# Patient Record
Sex: Female | Born: 1960 | Race: White | Hispanic: No | Marital: Married | Smoking: Never smoker
Health system: Southern US, Community
[De-identification: ages and names within clinical notes are randomized; demographics above are authoritative.]

## PROBLEM LIST (undated history)

## (undated) DIAGNOSIS — N938 Other specified abnormal uterine and vaginal bleeding: Secondary | ICD-10-CM

## (undated) DIAGNOSIS — F419 Anxiety disorder, unspecified: Secondary | ICD-10-CM

## (undated) HISTORY — DX: Anxiety disorder, unspecified: F41.9

## (undated) HISTORY — PX: APPENDECTOMY: SHX54

## (undated) HISTORY — DX: Other specified abnormal uterine and vaginal bleeding: N93.8

---

## 2001-02-23 ENCOUNTER — Other Ambulatory Visit: Admission: RE | Admit: 2001-02-23 | Discharge: 2001-02-23 | Payer: Self-pay | Admitting: Obstetrics and Gynecology

## 2001-09-18 ENCOUNTER — Inpatient Hospital Stay (HOSPITAL_COMMUNITY): Admission: AD | Admit: 2001-09-18 | Discharge: 2001-09-18 | Payer: Self-pay | Admitting: Obstetrics and Gynecology

## 2001-09-19 ENCOUNTER — Inpatient Hospital Stay (HOSPITAL_COMMUNITY): Admission: AD | Admit: 2001-09-19 | Discharge: 2001-09-21 | Payer: Self-pay | Admitting: *Deleted

## 2001-10-27 ENCOUNTER — Other Ambulatory Visit: Admission: RE | Admit: 2001-10-27 | Discharge: 2001-10-27 | Payer: Self-pay | Admitting: Obstetrics and Gynecology

## 2002-10-30 ENCOUNTER — Other Ambulatory Visit: Admission: RE | Admit: 2002-10-30 | Discharge: 2002-10-30 | Payer: Self-pay | Admitting: Obstetrics and Gynecology

## 2002-11-28 ENCOUNTER — Encounter: Payer: Self-pay | Admitting: Obstetrics and Gynecology

## 2002-11-28 ENCOUNTER — Ambulatory Visit (HOSPITAL_COMMUNITY): Admission: RE | Admit: 2002-11-28 | Discharge: 2002-11-28 | Payer: Self-pay | Admitting: Obstetrics and Gynecology

## 2003-05-06 ENCOUNTER — Inpatient Hospital Stay (HOSPITAL_COMMUNITY): Admission: AD | Admit: 2003-05-06 | Discharge: 2003-05-09 | Payer: Self-pay | Admitting: Obstetrics and Gynecology

## 2003-11-08 ENCOUNTER — Other Ambulatory Visit: Admission: RE | Admit: 2003-11-08 | Discharge: 2003-11-08 | Payer: Self-pay | Admitting: Obstetrics and Gynecology

## 2004-11-13 ENCOUNTER — Other Ambulatory Visit: Admission: RE | Admit: 2004-11-13 | Discharge: 2004-11-13 | Payer: Self-pay | Admitting: Obstetrics and Gynecology

## 2005-01-13 ENCOUNTER — Ambulatory Visit (HOSPITAL_COMMUNITY): Admission: RE | Admit: 2005-01-13 | Discharge: 2005-01-13 | Payer: Self-pay | Admitting: Obstetrics and Gynecology

## 2005-03-12 ENCOUNTER — Ambulatory Visit (HOSPITAL_COMMUNITY): Admission: RE | Admit: 2005-03-12 | Discharge: 2005-03-12 | Payer: Self-pay | Admitting: Obstetrics and Gynecology

## 2006-02-09 ENCOUNTER — Ambulatory Visit (HOSPITAL_COMMUNITY): Admission: RE | Admit: 2006-02-09 | Discharge: 2006-02-09 | Payer: Self-pay | Admitting: Obstetrics and Gynecology

## 2006-03-02 ENCOUNTER — Other Ambulatory Visit: Admission: RE | Admit: 2006-03-02 | Discharge: 2006-03-02 | Payer: Self-pay | Admitting: Obstetrics and Gynecology

## 2007-02-01 ENCOUNTER — Encounter: Admission: RE | Admit: 2007-02-01 | Discharge: 2007-02-01 | Payer: Self-pay | Admitting: *Deleted

## 2007-02-13 ENCOUNTER — Ambulatory Visit (HOSPITAL_COMMUNITY): Admission: RE | Admit: 2007-02-13 | Discharge: 2007-02-13 | Payer: Self-pay | Admitting: Obstetrics and Gynecology

## 2007-09-18 ENCOUNTER — Encounter: Admission: RE | Admit: 2007-09-18 | Discharge: 2007-09-18 | Payer: Self-pay | Admitting: *Deleted

## 2007-12-10 ENCOUNTER — Emergency Department (HOSPITAL_COMMUNITY): Admission: EM | Admit: 2007-12-10 | Discharge: 2007-12-10 | Payer: Self-pay | Admitting: Emergency Medicine

## 2008-03-19 ENCOUNTER — Encounter: Admission: RE | Admit: 2008-03-19 | Discharge: 2008-03-19 | Payer: Self-pay | Admitting: *Deleted

## 2008-03-26 ENCOUNTER — Ambulatory Visit (HOSPITAL_COMMUNITY): Admission: RE | Admit: 2008-03-26 | Discharge: 2008-03-26 | Payer: Self-pay | Admitting: Obstetrics and Gynecology

## 2008-04-07 ENCOUNTER — Encounter (INDEPENDENT_AMBULATORY_CARE_PROVIDER_SITE_OTHER): Payer: Self-pay | Admitting: Surgery

## 2008-04-07 ENCOUNTER — Observation Stay (HOSPITAL_COMMUNITY): Admission: EM | Admit: 2008-04-07 | Discharge: 2008-04-08 | Payer: Self-pay | Admitting: Emergency Medicine

## 2009-01-02 ENCOUNTER — Encounter: Admission: RE | Admit: 2009-01-02 | Discharge: 2009-01-02 | Payer: Self-pay | Admitting: Family Medicine

## 2009-03-14 ENCOUNTER — Encounter: Admission: RE | Admit: 2009-03-14 | Discharge: 2009-03-14 | Payer: Self-pay | Admitting: Family Medicine

## 2009-03-31 ENCOUNTER — Ambulatory Visit (HOSPITAL_COMMUNITY): Admission: RE | Admit: 2009-03-31 | Discharge: 2009-03-31 | Payer: Self-pay | Admitting: Obstetrics and Gynecology

## 2010-05-19 ENCOUNTER — Ambulatory Visit (HOSPITAL_COMMUNITY): Admission: RE | Admit: 2010-05-19 | Discharge: 2010-05-19 | Payer: Self-pay | Admitting: Obstetrics and Gynecology

## 2010-11-06 ENCOUNTER — Encounter: Admission: RE | Admit: 2010-11-06 | Discharge: 2010-11-06 | Payer: Self-pay | Admitting: Internal Medicine

## 2011-01-17 ENCOUNTER — Encounter: Payer: Self-pay | Admitting: Obstetrics and Gynecology

## 2011-01-17 ENCOUNTER — Encounter: Payer: Self-pay | Admitting: *Deleted

## 2011-04-20 ENCOUNTER — Other Ambulatory Visit (HOSPITAL_COMMUNITY): Payer: Self-pay | Admitting: Obstetrics and Gynecology

## 2011-04-20 DIAGNOSIS — Z1231 Encounter for screening mammogram for malignant neoplasm of breast: Secondary | ICD-10-CM

## 2011-05-11 NOTE — Op Note (Signed)
Madeline Valdez, Madeline Valdez NO.:  [id number]   MEDICAL RECORD NO.:  [id number]          PATIENT TYPE:  INP   LOCATION:  5151                         FACILITY:  [facility]   PHYSICIAN:  Wilmon Arms. [name], M.D. DATE OF BIRTH:  25-Feb-1961   DATE OF PROCEDURE:  DATE OF DISCHARGE:                               OPERATIVE REPORT   PREOPERATIVE DIAGNOSIS:  Acute appendicitis.   POSTOPERATIVE DIAGNOSIS:  Acute appendicitis.   PROCEDURE PERFORMED:  Laparoscopic appendectomy.   SURGEON:  Wilmon Arms. [name], M.D., FACS   ANESTHESIA:  General endotracheal.   INDICATIONS:  The patient is a 50 year old female who presents with a 1-  day history of periumbilical pain now migrated to the right lower  quadrant.  CT scan shows acute appendicitis.  White count was elevated  at 16.3.  We were then consulted for surgical management.   DESCRIPTION OF PROCEDURE:  The patient is brought to the operating room,  placed in the supine position on the operating table.  After an adequate  level of general anesthesia was obtained, a Foley catheter was placed  under sterile technique.  The patient's abdomen was prepped with  Betadine and draped in sterile fashion.  She is positioned with her left  arm tucked.  The area just above her umbilicus was infiltrated with 4%  Marcaine with epinephrine.  A transverse incision was made.  Dissection  was carried down to the left fascia.  The fascia was opened vertically  and the peritoneal cava was bluntly entered.  The stay sutures of 0-  Vicryl was placed around the fascial opening.  The Hasson cannula was  inserted and secured to the stay suture.  Pneumoperitoneum was obtained  by insufflating CO2, maintaining maximal pressure of 15 mmHg.  The  laparoscope was inserted, and the patient was positioned in the  Trendelenburg tilted to her left.  A 5 mm port was placed in the right  upper quadrant, another 5 mL port in the left lower quadrant.  The scope  was  moved to the right upper quadrant port site.  Glassman clamps used  to mobilize the cecum medially.  We expose a very thickened, inflamed  but nonperforated appendix.  There were some thin adhesions to this,  which were taken with the harmonic scalpel.  The mesoappendix was  divided with the harmonic scalpel.  Once we had cleared the appendix all  the way down to its base, it was divided with a GIA 45 stapler.  The  appendix was placed in an EndoCatch sac.  We carefully inspected the  staple line which was intact with no sign of bleeding.  We irrigated the  right lower quadrant.  The appendix was then removed through the  umbilical port site.  Pneumoperitoneum was then released.  Hasson  trocars were removed.  The stay suture was used to close the fascia  at the umbilicus.  4-0 Monocryl was used to close the skin incisions.  Steri-Strips and clean dressings were applied.  The Foley catheter was  removed.  The patient was extubated and brought to recovery room in  stable  condition.  All sponge, instrument, and needle counts were  correct.      Wilmon Arms. Tsuei, M.D.  Electronically Signed     MKT/MEDQ  D:  04/07/2008  T:  04/08/2008  Job:  [id number]

## 2011-05-11 NOTE — H&P (Signed)
Madeline Valdez, FARVE NO.:  [id number]   MEDICAL RECORD NO.:  [id number]          PATIENT TYPE:  INP   LOCATION:  5151                         FACILITY:  [facility]   PHYSICIAN:  Wilmon Arms. [name], M.D. DATE OF BIRTH:  [birth year]   DATE OF ADMISSION:  04/07/2008  DATE OF DISCHARGE:                              HISTORY & PHYSICAL   CHIEF COMPLAINT:  Right lower quadrant pain.   HISTORY OF PRESENT ILLNESS:  The patient is a 50 year old female who  presents with a 1-day history of periumbilical pain.  This has migrated  to the right lower quadrant and has become quite intense.  She denies  any nausea or vomiting.  She still has a good appetite.  She presented  to emergency department for evaluation, and a CT scan showed a thickened  retrocecal appendix.  Her white count was elevated.   PAST MEDICAL HISTORY:  Thyroid nodules, hypothyroidism, and anxiety  disorder.   PAST SURGICAL HISTORY:  None.   FAMILY HISTORY:  Hypertension and coronary artery disease.   SOCIAL HISTORY:  Nonsmoker and nondrinker.   ALLERGIES:  None.   MEDICATIONS:  Lexapro and levothyroxine.   PHYSICAL EXAMINATION:  VITAL SIGNS:  Temp 97.8, pulse 82, respirations  20, and blood pressure 107/64.  GENERAL:  This is a well-developed, well-nourished female in no apparent  distress.  HEENT:  EOMI.  Sclerae anicteric.  NECK:  No mass or thyromegaly.  LUNGS:  Clear.  Normal respiratory effort.  HEART:  Regular rate and rhythm with no murmur.  ABDOMEN:  Tender in the right lower quadrant.  No palpable mass.  Positive Rovsing sign.  Positive psoas sign.  EXTREMITIES:  No edema.  SKIN:  Warm, dry with no sign of jaundice.   LABORATORY DATA:  White count 16.3, hemoglobin 14.2, and platelets 235.  Electrolytes within normal limits.  CT scan shows a 13-mm thickened  appendix in the retrocecal location.  No sign of abscess.   IMPRESSION:  Acute appendicitis.   PLAN:  Laparoscopic  appendectomy.      Wilmon Arms. Tsuei, M.D.  Electronically Signed     MKT/MEDQ  D:  04/07/2008  T:  04/08/2008  Job:  [id number]

## 2011-05-14 NOTE — H&P (Signed)
[facility]  Patient:    Madeline Valdez, Madeline Valdez Visit Number: [id number] MRN: [id number]          Service Type: Attending:  Silverio Lay, M.D. Dictated by:   Silverio Lay, M.D. Adm. Date:  09/19/01                           History and Physical  DATE OF BIRTH:                April 07, 1961  REASON FOR ADMISSION:         Intrauterine pregnancy at 41 weeks and 3 days to undergo induction of labor for postdates.  HISTORY OF PRESENT ILLNESS:   This is a 50 year old, married white female, gravida 1 with a due date by ultrasound evaluation of September 09, 2001, being admitted today for induction of labor for postdates. She was last seen in the office on September 15, 2001, reporting good fetal activity, denying any contractions, denying any bleeding, denying any leaking of water, and denying any symptoms of pregnancy-induced hypertension. On that visit, she underwent an NST which was reactive. A recent ultrasound on September 11, 2001, at 40 weeks and 2 days revealed an estimated fetal weight of 8 pounds 7 ounces or 63rd percentile with an amniotic fluid index of 13.6 or 55th percentile, a biophysical profile of 8/8, and an umbilical artery Doppler within normal limits. She is admitted tonight to undergo cervical ripening and induction the next morning.  PRENATAL COURSE:              Blood type A positive. RPR nonreactive. Rubella immune. HBsAg negative. HIV negative. Pap smear within normal limits. Gonorrhea negative. Chlamydia negative. A first trimester ultrasound provided [place] with a due date of September 09, 2001. A 16-week triple test revealed an increased risk of Down syndrome of 1:94. These results were explained to the patient and father of the baby. Amniocentesis was offered but declined. A 20-week ultrasound revealed a normal anatomy survey with a normal cervical length, a posterior uterine fibroid of 5.7 x 3.8 x 4.7 cm which had  remained unchanged from the previous ultrasound. A 28-week glucose tolerance test was within normal limits. A 34-week ultrasound revealed an average for gestational age with a normal amniotic fluid index. A 35-week group B strep was negative. Prenatal course was otherwise remarkable for bilateral carpal tunnel syndrome which was greatly improved by wearing splints.  ALLERGIES:                    No known drug allergies.  PAST MEDICAL HISTORY:         History of infertility. No medication for this pregnancy.  FAMILY HISTORY:               Father with heart murmur requiring open heart surgery. Father of the babys sister has cerebral palsy.  SOCIAL HISTORY:               Married, nonsmoker. Works as an Environmental health practitioner.  PHYSICAL EXAMINATION:  VITAL SIGNS:                  Current weight is 194.6 pounds for a total weight gain during this pregnancy of 35 pounds; height is 5 feet 9 inches; blood pressure 104/80.  HEENT:                        Negative.  LUNGS:                        Clear.  HEART:                        Normal.  ABDOMEN:                      Gravid, nontender. Fundal height at 39 cm. Vertex presentation.  PELVIC:                       Vaginal exam:  Closed, 2 cm long, vertex, -1.  ASSESSMENT:                   Intrauterine pregnancy at 41 weeks and 3 days with unfavorable cervix average for gestational age. Increased risk of Down syndrome on triple test marker. Amniocentesis declined.  PLAN:                         The patient is admitted on September 19, 2001, to undergo cervical ripening with Cytotec and to undergo elective induction of labor the next day with Pitocin and artificial rupture of membranes. The patient is well aware of increased risk of cesarean section and of the length of possible labor, and she agrees with plan. Dictated by:   Silverio Lay, M.D. Attending:  Silverio Lay, M.D. DD:  09/19/01 TD:  09/19/01 Job: [id number]

## 2011-05-14 NOTE — H&P (Signed)
NAMEANALIZA, COWGER NO.:  [id number]   MEDICAL RECORD NO.:  [id number]                   PATIENT TYPE:  INP   LOCATION:  9169                                 FACILITY:  [facility]   PHYSICIAN:  Crist Fat. Rivard, M.D.              DATE OF BIRTH:  [birth year]   DATE OF ADMISSION:  05/06/2003  DATE OF DISCHARGE:                                HISTORY & PHYSICAL   HISTORY OF PRESENT ILLNESS:  The patient is a 50 year old married white  female (gravida 2, para 1-0-0-1), at 40-4/7 weeks by LMP confirmed by  ultrasound.  She presents complaining of uterine contractions every 4-5 mins  since about  8:00 p.m.  She denies any leaking or vaginal bleeding.  She reports positive  fetal movement.  She denies any nausea, vomiting, headache or visual  disturbances.  She is uncomfortable with uterine contractions and is  desiring an epidural for labor.  She was evaluated in the office earlier  today and was found to be  4 cm, 90%, vertex at a 0 station at that time.   PRESENT PREGNANCY HISTORY:  Her pregnancy has been followed at [facility] by the M.D. service, and has been at risk for advanced  maternal age (declining amniocentesis).  Her group B strep is negative.   OB-GYN HISTORY:  The patient is a gravida 2, para 1-0-0-1 who delivered a  viable female infant in December 2001, who weighed 7 pounds 15 ounces at [redacted]  weeks gestation following a 21-hr labor.  She delivered vaginally with no  complications, and that baby's name is Herbalist.  The patient had her last  menstrual period on 07/25/2002, giving her an EDC of 05/02/2003 (confirmed by  ultrasound).   GENERAL MEDICAL HISTORY:  She reports having had the usual childhood  diseases.  She has no medical problems.   ALLERGIES:  NO KNOWN DRUG ALLERGIES.   FAMILY HISTORY:  Significant for paternal grandfather with MI, father with  mitral valve prolapse requiring surgery.  Mother with hypertension and  varicosities.  Maternal grandmother with asthma.   GENETIC HISTORY:  Essentially negative, but she is over age 63 and declined  amniocentesis.  She has an aunt with cerebral palsy.   SOCIAL HISTORY:  She is married to [place], who is involved and supportive.  He is employed full time as an Art gallery manager, and she is a Press photographer.  They are of the [employer].  They deny any illicit drug use, alcohol or  smoking with this pregnancy.   PRENATAL LABS:  Blood type A positive.  Antibody screen negative.  Syphilis  nonreactive.  Rubella is immune.  Hepatitis B surface antigen negative.  GC and chlamydia both negative.  Pap  is within normal limits.  One-hour Glucola within normal range, and she  declined prenatal testing for genetic conditions.  A 36-week beta strep was negative.   PHYSICAL EXAMINATION:  VITAL SIGNS:  Stable.  She is afebrile.  HEENT:  Grossly within normal limits.  HEART:  Regular rate and rhythm.  CHEST:  Clear.  BREASTS:  Soft and nontender.  ABDOMEN:  Gravid with uterine contractions every 2-4 mins.  Fetal heart rate  is reactive and reassuring.  PELVIC:  Effaced 5 cm, 90%, vertex presentation at a 0 station, with intact  membranes (per DRN in maternity admissions).  EXTREMITIES:  Within normal limits.   ASSESSMENT:  1. Intrauterine pregnancy at term.  2. Active labor.  3. Desires epidural for labor.  4. Negative group B strep.   PLAN:  Admit to Labor and Delivery, per consult with Dr. Estanislado Pandy.  To place  an epidural for labor and follow routine M.D. orders.     Concha Pyo. Duplantis, C.N.M.              Crist Fat Rivard, M.D.    SJD/MEDQ  D:  05/06/2003  T:  05/07/2003  Job:  [id number]

## 2011-05-25 ENCOUNTER — Ambulatory Visit (HOSPITAL_COMMUNITY): Payer: BC Managed Care – PPO

## 2011-06-08 ENCOUNTER — Ambulatory Visit (HOSPITAL_COMMUNITY)
Admission: RE | Admit: 2011-06-08 | Discharge: 2011-06-08 | Disposition: A | Discharge status: Home / Self Care | Payer: BC Managed Care – PPO | Source: Ambulatory Visit | Attending: Obstetrics and Gynecology | Admitting: Obstetrics and Gynecology

## 2011-06-08 DIAGNOSIS — Z1231 Encounter for screening mammogram for malignant neoplasm of breast: Secondary | ICD-10-CM

## 2011-09-09 ENCOUNTER — Other Ambulatory Visit: Payer: Self-pay | Admitting: Family Medicine

## 2011-09-09 ENCOUNTER — Ambulatory Visit
Admission: RE | Admit: 2011-09-09 | Discharge: 2011-09-09 | Disposition: A | Discharge status: Home / Self Care | Payer: BC Managed Care – PPO | Source: Ambulatory Visit | Attending: Family Medicine | Admitting: Family Medicine

## 2011-09-09 DIAGNOSIS — E039 Hypothyroidism, unspecified: Secondary | ICD-10-CM

## 2011-09-09 DIAGNOSIS — R599 Enlarged lymph nodes, unspecified: Secondary | ICD-10-CM

## 2011-09-11 ENCOUNTER — Emergency Department (HOSPITAL_COMMUNITY)
Admission: EM | Admit: 2011-09-11 | Discharge: 2011-09-11 | Disposition: A | Discharge status: Home / Self Care | Payer: BC Managed Care – PPO | Attending: Emergency Medicine | Admitting: Emergency Medicine

## 2011-09-11 DIAGNOSIS — R5383 Other fatigue: Secondary | ICD-10-CM | POA: Insufficient documentation

## 2011-09-11 DIAGNOSIS — R232 Flushing: Secondary | ICD-10-CM | POA: Insufficient documentation

## 2011-09-11 DIAGNOSIS — Z79899 Other long term (current) drug therapy: Secondary | ICD-10-CM | POA: Insufficient documentation

## 2011-09-11 DIAGNOSIS — R5381 Other malaise: Secondary | ICD-10-CM | POA: Insufficient documentation

## 2011-09-11 DIAGNOSIS — F411 Generalized anxiety disorder: Secondary | ICD-10-CM | POA: Insufficient documentation

## 2011-09-11 DIAGNOSIS — E039 Hypothyroidism, unspecified: Secondary | ICD-10-CM | POA: Insufficient documentation

## 2011-09-11 DIAGNOSIS — R42 Dizziness and giddiness: Secondary | ICD-10-CM | POA: Insufficient documentation

## 2011-09-11 LAB — URINALYSIS, ROUTINE W REFLEX MICROSCOPIC
Bilirubin Urine: NEGATIVE
Glucose, UA: NEGATIVE mg/dL
Ketones, ur: NEGATIVE mg/dL
Leukocytes, UA: NEGATIVE
Nitrite: NEGATIVE
Protein, ur: NEGATIVE mg/dL
Specific Gravity, Urine: 1.006 (ref 1.005–1.030)
Urobilinogen, UA: 0.2 mg/dL (ref 0.0–1.0)
pH: 7.5 (ref 5.0–8.0)

## 2011-09-11 LAB — POCT I-STAT, CHEM 8
BUN: 15 mg/dL (ref 6–23)
Calcium, Ion: 1.19 mmol/L (ref 1.12–1.32)
Chloride: 106 mEq/L (ref 96–112)
Creatinine, Ser: 0.9 mg/dL (ref 0.50–1.10)
Glucose, Bld: 101 mg/dL — ABNORMAL HIGH (ref 70–99)
HCT: 39 % (ref 36.0–46.0)
Hemoglobin: 13.3 g/dL (ref 12.0–15.0)
Potassium: 3.8 mEq/L (ref 3.5–5.1)
Sodium: 141 mEq/L (ref 135–145)
TCO2: 22 mmol/L (ref 0–100)

## 2011-09-11 LAB — POCT PREGNANCY, URINE: Preg Test, Ur: NEGATIVE

## 2011-09-11 LAB — URINE MICROSCOPIC-ADD ON

## 2011-09-21 LAB — CBC
HCT: 41.2
Hemoglobin: 14.2
MCHC: 34.4
MCV: 91.9
Platelets: 235
RBC: 4.49
RDW: 12.3
WBC: 16.3 — ABNORMAL HIGH

## 2011-09-21 LAB — DIFFERENTIAL
Basophils Absolute: 0
Basophils Relative: 0
Eosinophils Absolute: 0
Eosinophils Relative: 0
Lymphocytes Relative: 9 — ABNORMAL LOW
Lymphs Abs: 1.5
Monocytes Absolute: 1
Monocytes Relative: 6
Neutro Abs: 13.8 — ABNORMAL HIGH
Neutrophils Relative %: 85 — ABNORMAL HIGH

## 2011-09-21 LAB — URINALYSIS, ROUTINE W REFLEX MICROSCOPIC
Bilirubin Urine: NEGATIVE
Glucose, UA: NEGATIVE
Hgb urine dipstick: NEGATIVE
Ketones, ur: NEGATIVE
Nitrite: NEGATIVE
Protein, ur: NEGATIVE
Specific Gravity, Urine: 1.012
Urobilinogen, UA: 0.2
pH: 8

## 2011-09-21 LAB — POCT PREGNANCY, URINE
Operator id: 29452
Preg Test, Ur: NEGATIVE

## 2011-09-21 LAB — COMPREHENSIVE METABOLIC PANEL
ALT: 17
AST: 18
Albumin: 4.2
Alkaline Phosphatase: 33 — ABNORMAL LOW
BUN: 9
CO2: 28
Calcium: 9.7
Chloride: 103
Creatinine, Ser: 0.82
GFR calc Af Amer: >60
GFR calc non Af Amer: >60
Glucose, Bld: 116 — ABNORMAL HIGH
Potassium: 3.8
Sodium: 139
Total Bilirubin: 1.4 — ABNORMAL HIGH
Total Protein: 7.6

## 2011-09-21 LAB — GC/CHLAMYDIA PROBE AMP, GENITAL
Chlamydia, DNA Probe: NEGATIVE
GC Probe Amp, Genital: NEGATIVE

## 2011-09-21 LAB — WET PREP, GENITAL
Clue Cells Wet Prep HPF POC: NONE SEEN
Trich, Wet Prep: NONE SEEN
Yeast Wet Prep HPF POC: NONE SEEN

## 2012-05-05 ENCOUNTER — Other Ambulatory Visit: Payer: Self-pay | Admitting: Obstetrics and Gynecology

## 2012-05-05 DIAGNOSIS — Z1231 Encounter for screening mammogram for malignant neoplasm of breast: Secondary | ICD-10-CM

## 2012-06-08 ENCOUNTER — Ambulatory Visit
Admission: RE | Admit: 2012-06-08 | Discharge: 2012-06-08 | Disposition: A | Discharge status: Home / Self Care | Payer: BC Managed Care – PPO | Source: Ambulatory Visit | Attending: Obstetrics and Gynecology | Admitting: Obstetrics and Gynecology

## 2012-06-08 DIAGNOSIS — Z1231 Encounter for screening mammogram for malignant neoplasm of breast: Secondary | ICD-10-CM

## 2012-08-18 ENCOUNTER — Telehealth: Payer: Self-pay | Admitting: Obstetrics and Gynecology

## 2012-08-21 ENCOUNTER — Ambulatory Visit (INDEPENDENT_AMBULATORY_CARE_PROVIDER_SITE_OTHER): Payer: BC Managed Care – PPO | Admitting: Obstetrics and Gynecology

## 2012-08-21 ENCOUNTER — Encounter: Payer: Self-pay | Admitting: Obstetrics and Gynecology

## 2012-08-21 VITALS — BP 100/66 | HR 72 | Ht 68.5 in | Wt 173.0 lb

## 2012-08-21 DIAGNOSIS — N912 Amenorrhea, unspecified: Secondary | ICD-10-CM

## 2012-08-21 DIAGNOSIS — R300 Dysuria: Secondary | ICD-10-CM

## 2012-08-21 DIAGNOSIS — Z124 Encounter for screening for malignant neoplasm of cervix: Secondary | ICD-10-CM

## 2012-08-21 DIAGNOSIS — F419 Anxiety disorder, unspecified: Secondary | ICD-10-CM | POA: Insufficient documentation

## 2012-08-21 DIAGNOSIS — N938 Other specified abnormal uterine and vaginal bleeding: Secondary | ICD-10-CM | POA: Insufficient documentation

## 2012-08-21 DIAGNOSIS — Z01419 Encounter for gynecological examination (general) (routine) without abnormal findings: Secondary | ICD-10-CM

## 2012-08-21 DIAGNOSIS — N951 Menopausal and female climacteric states: Secondary | ICD-10-CM

## 2012-08-21 LAB — POCT URINALYSIS DIPSTICK
Bilirubin, UA: NEGATIVE
Blood, UA: NEGATIVE
Glucose, UA: NEGATIVE
Ketones, UA: NEGATIVE
Leukocytes, UA: NEGATIVE
Nitrite, UA: NEGATIVE
Protein, UA: NEGATIVE
Spec Grav, UA: <=1.005
Urobilinogen, UA: NEGATIVE
pH, UA: 7

## 2012-08-21 MED ORDER — NYSTATIN-TRIAMCINOLONE 100000-0.1 UNIT/GM-% EX OINT: TOPICAL_OINTMENT | Freq: Three times a day (TID) | CUTANEOUS | Status: AC

## 2012-08-21 NOTE — Progress Notes (Signed)
The patient reports:she complains of irregular periods, vaginal irritation Contraception:no method  Last mammogram: was normal  June 2013 Last pap: was normal March  2010  GC/Chlamydia cultures offered: declined HIV/RPR/HbsAg offered:  declined HSV 1 and 2 glycoprotein offered: declined  Menstrual cycle regular and monthly: No:  Menstrual flow normal: No:   Urinary symptoms: burning U/A negative Normal bowel movements: Yes Reports abuse at home: No:   Subjective:    Madeline Valdez is a 51 y.o. female, G3P2, who presents for an annual exam.     History   Social History  . Marital Status: Married    Spouse Name: N/A    Number of Children: N/A  . Years of Education: N/A   Social History Main Topics  . Smoking status: Never Smoker   . Smokeless tobacco: None  . Alcohol Use: Yes     occ  . Drug Use: No  . Sexually Active: None   Other Topics Concern  . None   Social History Narrative  . None    Menstrual cycle:   LMP: Patient's last menstrual period was 06/10/2012.Now at 2 months           Cycle: irregular since 11/2011. C/O vaginal dryness.Not using contraception. Menopausal symptoms: night sweats on/off, no mood swings, occ hot flashes  The following portions of the patient's history were reviewed and updated as appropriate: allergies, current medications, past family history, past medical history, past social history, past surgical history and problem list.  Review of Systems Pertinent items are noted in HPI. Breast:Negative for breast lump,nipple discharge or nipple retraction Gastrointestinal: Negative for abdominal pain, change in bowel habits or rectal bleeding Urinary:negative   Objective:    BP 100/66  Pulse 72  Ht 5' 8.5" (1.74 m)  Wt 173 lb (78.472 kg)  BMI 25.92 kg/m2  LMP 06/10/2012    Weight:  Wt Readings from Last 1 Encounters:  08/21/12 173 lb (78.472 kg)          BMI: Body mass index is 25.92 kg/(m^2).  General Appearance: Alert,  appropriate appearance for age. No acute distress HEENT: Grossly normal Neck / Thyroid: Supple, no masses, nodes or enlargement Lungs: clear to auscultation bilaterally Back: No CVA tenderness Breast Exam: No masses or nodes.No dimpling, nipple retraction or discharge. Cardiovascular: Regular rate and rhythm. S1, S2, no murmur Gastrointestinal: Soft, non-tender, no masses or organomegaly Pelvic Exam: Vulva and vagina appear normal. Bimanual exam reveals normal uterus and adnexa. Rectovaginal: normal rectal, no masses Lymphatic Exam: Non-palpable nodes in neck, clavicular, axillary, or inguinal regions Skin: no rash or abnormalities Neurologic: Normal gait and speech, no tremor  Psychiatric: Alert and oriented, appropriate affect.   Urinalysis:negative UPT: Negative   Assessment:    Normal gyn exam perimenopause    Plan:    mammogram pap smear return 3 months  STD screening: declined Contraception:oral contraceptives (estrogen/progesterone) Lo-Loestrin for 3 months      MD

## 2012-08-22 ENCOUNTER — Telehealth: Payer: Self-pay

## 2012-08-22 LAB — PAP IG W/ RFLX HPV ASCU

## 2012-08-22 LAB — FOLLICLE STIMULATING HORMONE: FSH: 43.8 m[IU]/mL

## 2012-08-22 NOTE — Progress Notes (Signed)
Quick Note:  Please call patient and inform that she is perimenopausal. May choose to not start the pill and observe cycles or use pill for 6 months and be retested. ______

## 2012-08-22 NOTE — Telephone Encounter (Signed)
Tc to pt per test results. Pt informed per vph recs as follows: Inform that she is perimenopausal. May choose to not start the pill and observe cycles or use pill for 6 months and be retested.Pt states,"will decide which method she opts to choose and will cb if further hormonal testing is needed. Pt agrees.

## 2012-08-23 ENCOUNTER — Telehealth: Payer: Self-pay

## 2012-08-23 LAB — URINE CULTURE
Colony Count: NO GROWTH
Organism ID, Bacteria: NO GROWTH

## 2012-08-23 NOTE — Telephone Encounter (Signed)
Pt notified of lab result and chooses to observe cycles.  Will come in for retest in 6 months. Reminded pt to use condoms with EVERY IC.  Pt agreeable.  ld

## 2012-08-23 NOTE — Telephone Encounter (Signed)
Message copied by Larwance Rote on Wed Aug 23, 2012  3:02 PM ------      Message from: Silverio Lay      Created: Tue Aug 22, 2012  1:48 PM       Please call patient and inform that she is perimenopausal. May choose to not start the pill and observe cycles or use pill for 6 months and be retested.

## 2012-09-05 ENCOUNTER — Telehealth: Payer: Self-pay | Admitting: Obstetrics and Gynecology

## 2012-09-05 NOTE — Telephone Encounter (Signed)
TC to pt. LM to return call.  

## 2012-09-06 ENCOUNTER — Telehealth: Payer: Self-pay | Admitting: Obstetrics and Gynecology

## 2012-09-06 NOTE — Telephone Encounter (Signed)
TC to pt. Informed that is perimenopausal. May choose to not start the pill and observe cycles or use pill for 6 months and be retested. Pt states has not started the pill and will observe. Is aware that can become pregnant if perimenopausal. Plan reviewed with LD. Pt to return in 1 year for annual or sooner with concerns.  Pt verbalizes comprehension.

## 2012-09-06 NOTE — Telephone Encounter (Signed)
Message copied by Mason Jim on Wed Sep 06, 2012  2:57 PM ------      Message from: Silverio Lay      Created: Tue Aug 22, 2012  1:48 PM       Please call patient and inform that she is perimenopausal. May choose to not start the pill and observe cycles or use pill for 6 months and be retested.

## 2012-09-06 NOTE — Telephone Encounter (Signed)
Message copied by Mason Jim on Wed Sep 06, 2012  3:04 PM ------      Message from: Silverio Lay      Created: Tue Aug 22, 2012  1:48 PM       Please call patient and inform that she is perimenopausal. May choose to not start the pill and observe cycles or use pill for 6 months and be retested.

## 2013-04-30 ENCOUNTER — Other Ambulatory Visit: Payer: Self-pay | Admitting: Gastroenterology

## 2013-09-24 ENCOUNTER — Other Ambulatory Visit: Payer: Self-pay

## 2013-09-24 DIAGNOSIS — Z1231 Encounter for screening mammogram for malignant neoplasm of breast: Secondary | ICD-10-CM

## 2013-10-18 ENCOUNTER — Ambulatory Visit
Admission: RE | Admit: 2013-10-18 | Discharge: 2013-10-18 | Disposition: A | Discharge status: Home / Self Care | Payer: BC Managed Care – PPO | Source: Ambulatory Visit

## 2013-10-18 DIAGNOSIS — Z1231 Encounter for screening mammogram for malignant neoplasm of breast: Secondary | ICD-10-CM

## 2013-12-06 ENCOUNTER — Other Ambulatory Visit: Payer: Self-pay | Admitting: Family

## 2013-12-06 ENCOUNTER — Ambulatory Visit
Admission: RE | Admit: 2013-12-06 | Discharge: 2013-12-06 | Disposition: A | Discharge status: Home / Self Care | Payer: BC Managed Care – PPO | Source: Ambulatory Visit | Attending: Family | Admitting: Family

## 2013-12-06 ENCOUNTER — Ambulatory Visit: Payer: BC Managed Care – PPO | Admitting: Interventional Cardiology

## 2013-12-06 DIAGNOSIS — M79609 Pain in unspecified limb: Secondary | ICD-10-CM

## 2013-12-13 ENCOUNTER — Encounter: Payer: Self-pay | Admitting: Interventional Cardiology

## 2013-12-13 ENCOUNTER — Ambulatory Visit (INDEPENDENT_AMBULATORY_CARE_PROVIDER_SITE_OTHER): Payer: BC Managed Care – PPO | Admitting: Interventional Cardiology

## 2013-12-13 VITALS — BP 102/80 | HR 77 | Ht 68.5 in | Wt 172.0 lb

## 2013-12-13 DIAGNOSIS — E039 Hypothyroidism, unspecified: Secondary | ICD-10-CM | POA: Insufficient documentation

## 2013-12-13 DIAGNOSIS — R002 Palpitations: Secondary | ICD-10-CM

## 2013-12-13 NOTE — Progress Notes (Signed)
Patient ID: Madeline Valdez, female   DOB: 24-Aug-1961, 52 y.o.   MRN: [id number]     Patient ID: Madeline Valdez MRN: [id number] DOB/AGE: 09-02-1961 52 y.o.   Referring Physician [facility]   Reason for Consultation: palpitations  HPI: 52 y/o who has had thyroid problems.  She feels better over all but has had some skippe dheart beat sensations.  They last a second and are more noticeable when she is still.  They occur a few times a week.  No chest pain, SHOB, syncope.  No prior cardiac w/u.  She drinks 3 servings of caffeine daily.  No sustained episodes.  She had an increased TSH and thyroid med was recently increased.  She does cardio and does some classes.  No additional skipping with exercise.   Father had valve surgery in his 36s.  No CABG.  No CAD in the family.     Current Outpatient Prescriptions  Medication Sig Dispense Refill  . escitalopram (LEXAPRO) 10 MG tablet Take 10 mg by mouth daily.      Marland Kitchen levothyroxine (SYNTHROID, LEVOTHROID) 100 MCG tablet Take 100 mcg by mouth daily before breakfast.       No current facility-administered medications for this visit.   Past Medical History  Diagnosis Date  . Anxiety   . DUB (dysfunctional uterine bleeding)     Family History  Problem Relation Age of Onset  . Heart murmur Father     open heart surgery    History   Social History  . Marital Status: Married    Spouse Name: N/A    Number of Children: N/A  . Years of Education: N/A   Occupational History  . Not on file.   Social History Main Topics  . Smoking status: Never Smoker   . Smokeless tobacco: Never Used  . Alcohol Use: Yes     Comment: occ  . Drug Use: No  . Sexual Activity: Yes    Birth Control/ Protection: None   Other Topics Concern  . Not on file   Social History Narrative  . No narrative on file    Past Surgical History  Procedure Laterality Date  . Appendectomy        (Not in a hospital admission)  Review of systems  complete and found to be negative unless listed above .  No nausea, vomiting.  No fever chills, No focal weakness,  No palpitations.  Physical Exam: Filed Vitals:   12/13/13 0947  BP: 102/80  Pulse: 77    Weight: 172 lb (78.019 kg)  Physical exam:  [place]/AT EOMI No JVD, No carotid bruit RRR S1S2  No wheezing Soft. NT, nondistended No edema No focal motor or sensory deficits Normal affect  Labs:   Lab Results  Component Value Date   WBC 16.3* 04/07/2008   HGB 13.3 09/11/2011   HCT 39.0 09/11/2011   MCV 91.9 04/07/2008   PLT 235 04/07/2008   No results found for this basename: NA, K, CL, CO2, BUN, CREATININE, CALCIUM, LABALBU, PROT, BILITOT, ALKPHOS, ALT, AST, GLUCOSE,  in the last 168 hours No results found for this basename: CKTOTAL, CKMB, CKMBINDEX, TROPONINI    No results found for this basename: CHOL   No results found for this basename: HDL   No results found for this basename: LDLCALC   No results found for this basename: TRIG   No results found for this basename: CHOLHDL   No results found for this basename: LDLDIRECT  EKG: NSR, short PR, no ST segment changes  ASSESSMENT AND PLAN:  Palpitations: Sx sound like premature beats, either PACs or PVCs.  We discussed a monitor.  Her symptoms are not that bothersome at this point. If they get worse, she will let us know. No worrisome symptoms with the extra heartbeats. She continues to be physically active. No further testing required at this time.    If she has sustained episodes, she will let us know as well.   Hypothyroidism: Thyroid medicine was recently adjusted. Her palpitations may normalize after sufficient time after this change. Signed:   Fredric Mare, MD, [facility] 12/13/2013, 10:04 AM

## 2013-12-13 NOTE — Patient Instructions (Signed)
Your physician recommends that you schedule a follow-up appointment as needed  

## 2014-06-11 ENCOUNTER — Other Ambulatory Visit: Payer: Self-pay | Admitting: Family

## 2014-06-11 ENCOUNTER — Ambulatory Visit
Admission: RE | Admit: 2014-06-11 | Discharge: 2014-06-11 | Disposition: A | Discharge status: Home / Self Care | Payer: BC Managed Care – PPO | Source: Ambulatory Visit | Attending: Family | Admitting: Family

## 2014-06-11 DIAGNOSIS — M79672 Pain in left foot: Secondary | ICD-10-CM

## 2014-06-27 ENCOUNTER — Ambulatory Visit (INDEPENDENT_AMBULATORY_CARE_PROVIDER_SITE_OTHER): Payer: BC Managed Care – PPO | Admitting: Podiatry

## 2014-06-27 ENCOUNTER — Encounter: Payer: Self-pay | Admitting: Podiatry

## 2014-06-27 ENCOUNTER — Ambulatory Visit (INDEPENDENT_AMBULATORY_CARE_PROVIDER_SITE_OTHER): Payer: BC Managed Care – PPO

## 2014-06-27 VITALS — BP 109/66 | HR 74 | Resp 16 | Ht 68.5 in | Wt 158.0 lb

## 2014-06-27 DIAGNOSIS — M722 Plantar fascial fibromatosis: Secondary | ICD-10-CM

## 2014-06-27 DIAGNOSIS — M779 Enthesopathy, unspecified: Secondary | ICD-10-CM

## 2014-06-27 MED ORDER — METHYLPREDNISOLONE (PAK) 4 MG PO TABS: ORAL_TABLET | ORAL | Status: AC

## 2014-06-27 MED ORDER — MELOXICAM 15 MG PO TABS: 15.0000 mg | ORAL_TABLET | Freq: Every day | ORAL | Status: AC

## 2014-06-27 NOTE — Progress Notes (Signed)
   Subjective:    Patient ID: Madeline Valdez, female    DOB: July 01, 1961, 53 y.o.   MRN: [id number]  HPI Comments: Left foot pain , the lateral side a lump on the side of foot . Tender pain almost like a bruise yourself. Hurts when bumping it.  After being on feet for a long time after activity it hurts. It has been there for months. Had a xray at [facility].   Foot Pain      Review of Systems  HENT:       Ringing in ears   Musculoskeletal:       Joint and back pain   All other systems reviewed and are negative.      Objective:   Physical Exam: I have reviewed his past medical history medications allergies surgeries social history and review of systems. Pulses are strongly palpable bilateral. Neurologic sensorium is intact per since once the monofilament. Deep tendon reflexes are intact bilateral muscle strength is 5 over 5 dorsiflexors plantar flexors inverters everters all intrinsic musculature is intact. Orthopedic evaluation demonstrates all joints distal to the ankle a full range of motion without crepitus. She has pain on direct palpation of the fifth metatarsal base superiorly at the peroneal brevis insertion site. Radiographic evaluation does not demonstrate any type of osseous abnormalities however soft tissue increase in density is noted in this area. Cutaneous evaluation demonstrates supple well hydrated cutis no erythema edema cellulitis drainage or odor.        Assessment & Plan:  Assessment: Insertional peroneal brevis tendinitis fifth metatarsal base left foot.  Plan: Discussed etiology pathology conservative versus surgical therapies. Discussed appropriate shoe gear stretching exercises ice therapy shoe gear modifications. We also injected the area today to the point of maximal tenderness with 2 mg of local anesthetic and dexamethasone. She was dispensed a prescription for Medrol Dosepak to be followed by Monday. I will followup with her in a month or so.

## 2014-07-30 ENCOUNTER — Ambulatory Visit (INDEPENDENT_AMBULATORY_CARE_PROVIDER_SITE_OTHER): Payer: BC Managed Care – PPO | Admitting: Podiatry

## 2014-07-30 VITALS — BP 110/68 | HR 76 | Resp 16

## 2014-07-30 DIAGNOSIS — M779 Enthesopathy, unspecified: Secondary | ICD-10-CM

## 2014-07-30 DIAGNOSIS — M722 Plantar fascial fibromatosis: Secondary | ICD-10-CM

## 2014-07-30 NOTE — Progress Notes (Signed)
She presents today for followup of her plantar fasciitis and her peroneal tendinitis left foot. She states it seems to be doing pretty good.  Objective: Vital signs are stable she is alert and oriented x3. There is no erythema edema cellulitis drainage or odor. She has no pain on palpation medial continued tubercle of the left heel.  Assessment: Well-healing plantar fasciitis peroneal tendinitis.  Plan: Continue all conservative therapies

## 2014-10-28 ENCOUNTER — Encounter: Payer: Self-pay | Admitting: Podiatry

## 2015-05-20 ENCOUNTER — Other Ambulatory Visit: Payer: Self-pay

## 2015-05-20 DIAGNOSIS — Z1231 Encounter for screening mammogram for malignant neoplasm of breast: Secondary | ICD-10-CM

## 2015-06-17 ENCOUNTER — Ambulatory Visit
Admission: RE | Admit: 2015-06-17 | Discharge: 2015-06-17 | Disposition: A | Discharge status: Home / Self Care | Payer: BLUE CROSS/BLUE SHIELD | Source: Ambulatory Visit

## 2015-06-17 DIAGNOSIS — Z1231 Encounter for screening mammogram for malignant neoplasm of breast: Secondary | ICD-10-CM

## 2016-04-05 IMAGING — MG MM SCREENING BREAST TOMO BILATERAL
8 series · 8 of 24 positions shown · non-contrast
Comparison: Previous exam(s).

CLINICAL DATA: Screening.

EXAM:
DIGITAL SCREENING BILATERAL MAMMOGRAM WITH 3D TOMO WITH CAD

[L MLO]
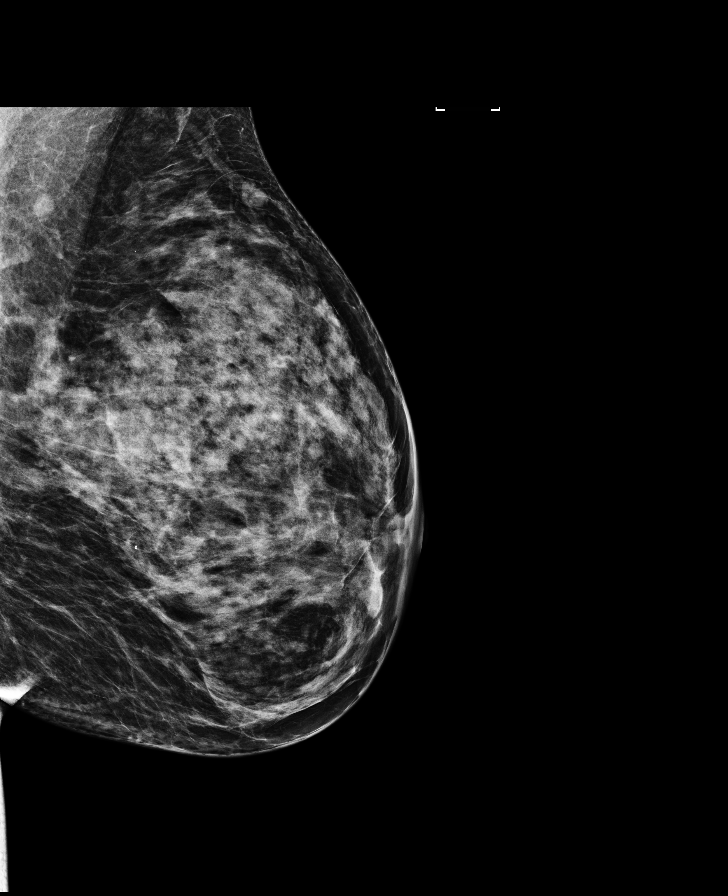

[R MLO]
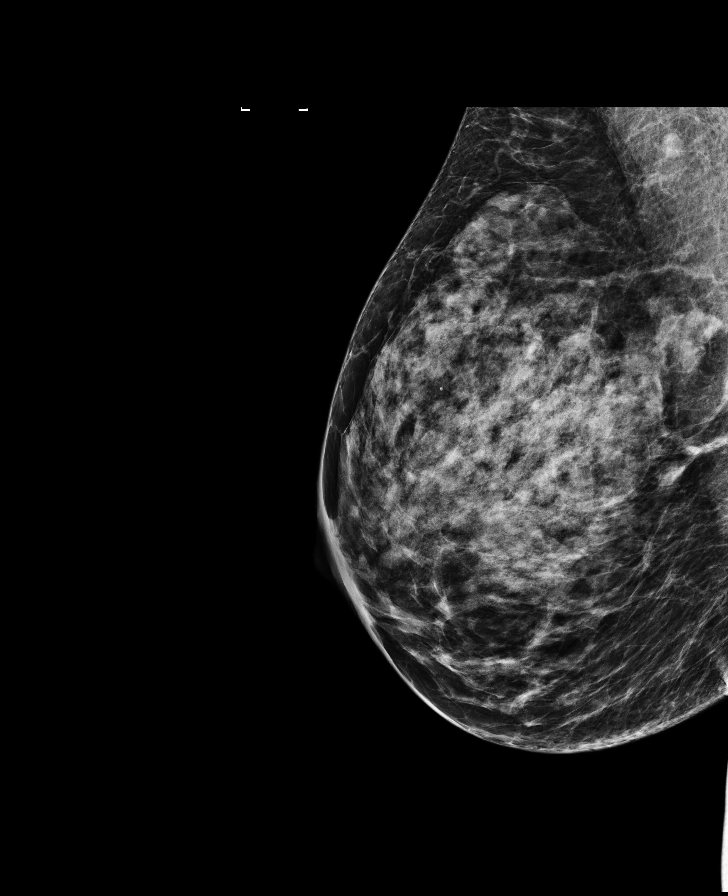

[L CC]
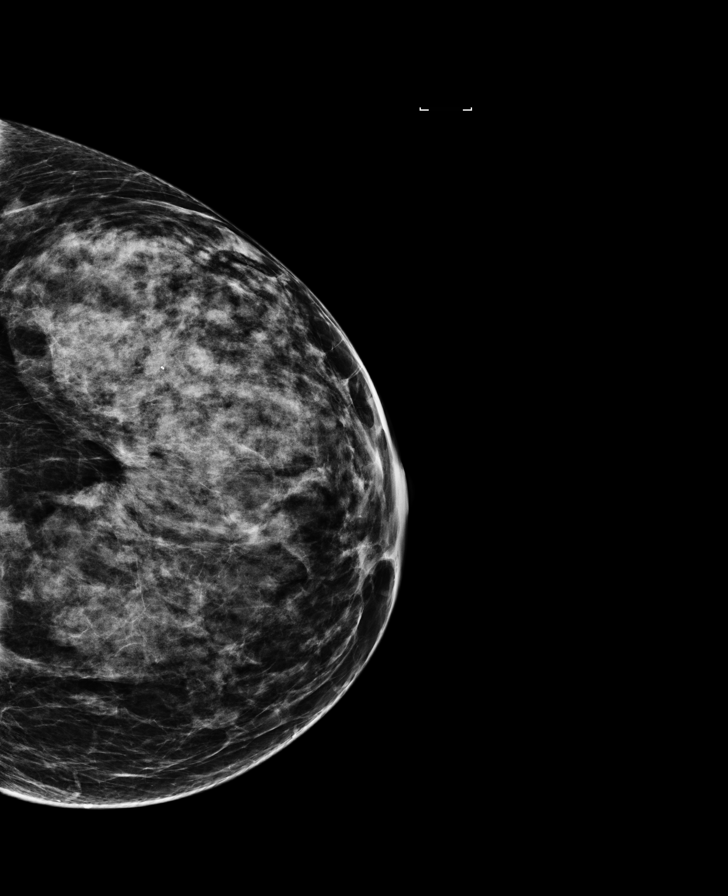

[R CC]
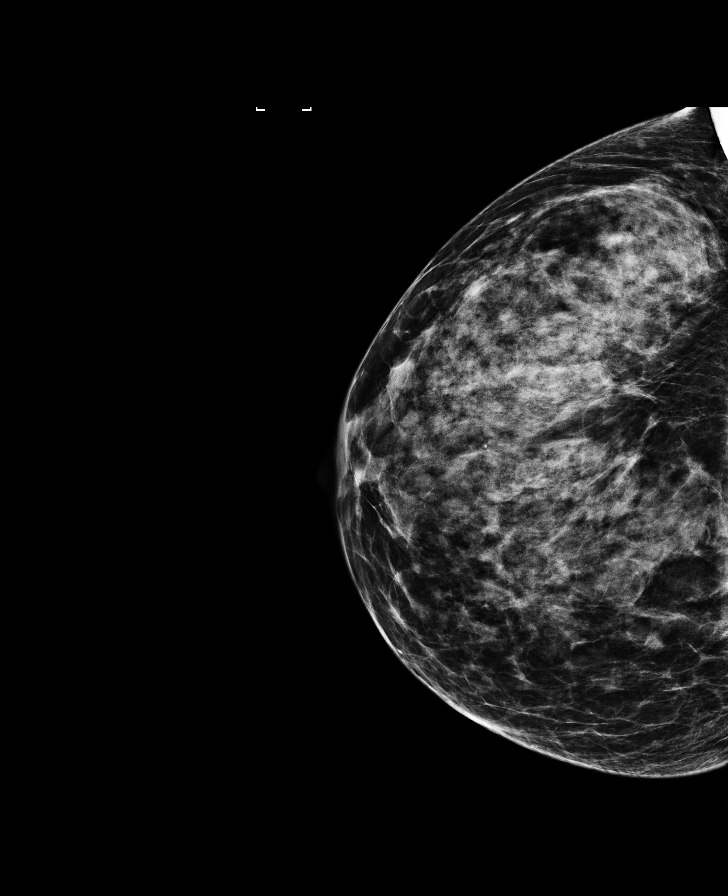

[L MLO tomo · tomo slice 31/62.0]
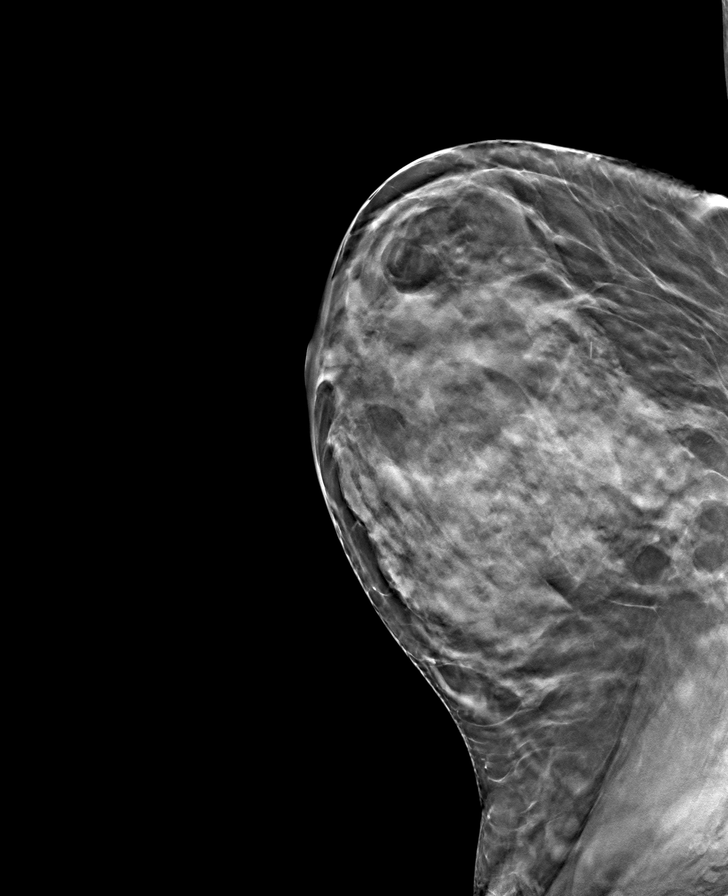

[L CC tomo · tomo slice 29/57.0]
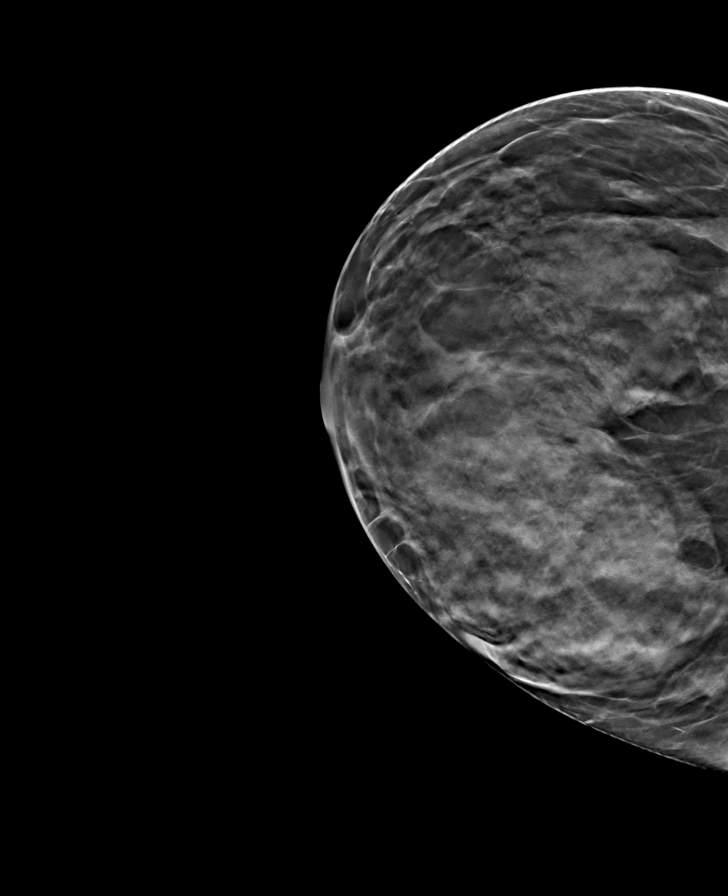

[R CC tomo · tomo slice 30/59.0]
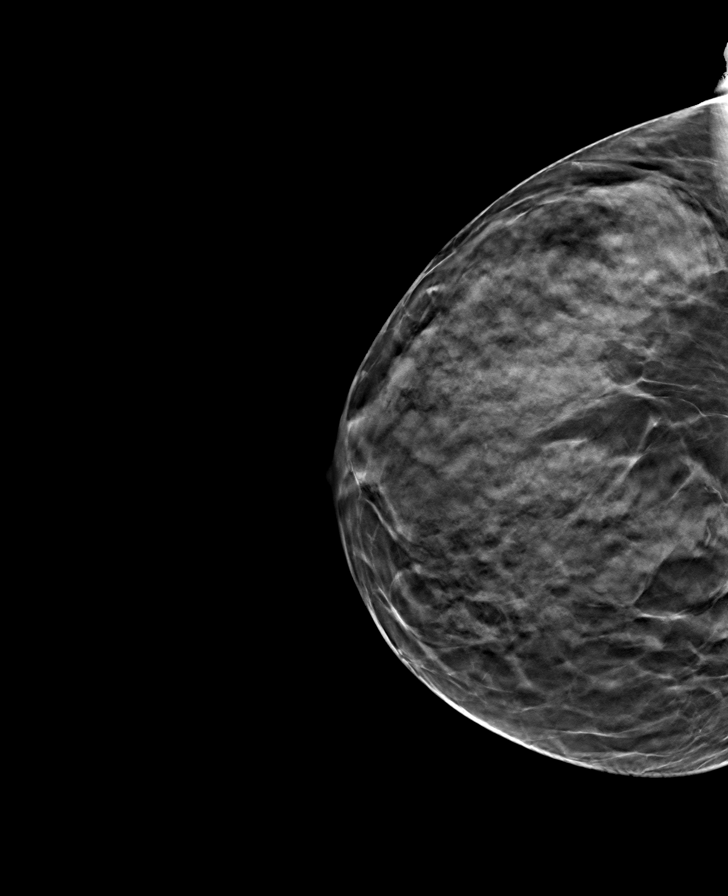

[R MLO tomo · tomo slice 30/59.0]
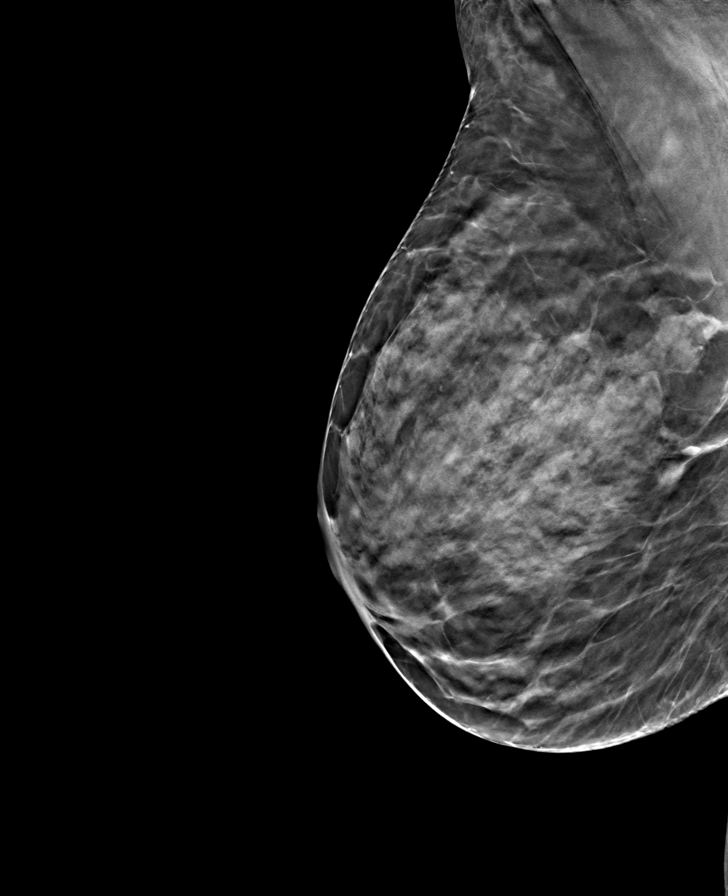

[8 of 24 positions shown; findings below may reference images not displayed]

ACR Breast Density Category d: The breast tissue is extremely dense,
which lowers the sensitivity of mammography.
FINDINGS: There are no findings suspicious for malignancy. Images were
processed with CAD.
IMPRESSION: No mammographic evidence of malignancy. A result letter of this
screening mammogram will be mailed directly to the patient.

RECOMMENDATION:
Screening mammogram in one year. (Code:[id number])

BI-RADS CATEGORY  1: Negative.

## 2024-02-28 ENCOUNTER — Other Ambulatory Visit: Payer: Self-pay | Admitting: Obstetrics and Gynecology

## 2024-02-28 DIAGNOSIS — Z1231 Encounter for screening mammogram for malignant neoplasm of breast: Secondary | ICD-10-CM
